# Patient Record
Sex: Female | Born: 1990 | Race: White | Hispanic: No | Marital: Single
Health system: Southern US, Community
[De-identification: ages and names within clinical notes are randomized; demographics above are authoritative.]

---

## 2018-03-02 ENCOUNTER — Encounter (HOSPITAL_COMMUNITY): Payer: Self-pay

## 2018-03-02 ENCOUNTER — Emergency Department (HOSPITAL_COMMUNITY): Payer: Self-pay

## 2018-03-02 ENCOUNTER — Other Ambulatory Visit: Payer: Self-pay

## 2018-03-02 ENCOUNTER — Emergency Department (HOSPITAL_COMMUNITY)
Admission: EM | Admit: 2018-03-02 | Discharge: 2018-03-02 | Disposition: A | Discharge status: Home / Self Care | Payer: Self-pay | Attending: Emergency Medicine | Admitting: Emergency Medicine

## 2018-03-02 DIAGNOSIS — N949 Unspecified condition associated with female genital organs and menstrual cycle: Secondary | ICD-10-CM

## 2018-03-02 DIAGNOSIS — O26892 Other specified pregnancy related conditions, second trimester: Secondary | ICD-10-CM

## 2018-03-02 DIAGNOSIS — O23592 Infection of other part of genital tract in pregnancy, second trimester: Secondary | ICD-10-CM | POA: Insufficient documentation

## 2018-03-02 DIAGNOSIS — N760 Acute vaginitis: Secondary | ICD-10-CM

## 2018-03-02 DIAGNOSIS — Z3A16 16 weeks gestation of pregnancy: Secondary | ICD-10-CM | POA: Insufficient documentation

## 2018-03-02 DIAGNOSIS — Z79899 Other long term (current) drug therapy: Secondary | ICD-10-CM | POA: Insufficient documentation

## 2018-03-02 DIAGNOSIS — R109 Unspecified abdominal pain: Secondary | ICD-10-CM

## 2018-03-02 DIAGNOSIS — B9689 Other specified bacterial agents as the cause of diseases classified elsewhere: Secondary | ICD-10-CM

## 2018-03-02 DIAGNOSIS — R110 Nausea: Secondary | ICD-10-CM

## 2018-03-02 LAB — COMPREHENSIVE METABOLIC PANEL
ALK PHOS: 63 U/L (ref 38–126)
ALT: 15 U/L (ref 0–44)
ANION GAP: 8 (ref 5–15)
AST: 16 U/L (ref 15–41)
Albumin: 3.6 g/dL (ref 3.5–5.0)
BILIRUBIN TOTAL: 0.4 mg/dL (ref 0.3–1.2)
BUN: 5 mg/dL — ABNORMAL LOW (ref 6–20)
CALCIUM: 9.2 mg/dL (ref 8.9–10.3)
CO2: 22 mmol/L (ref 22–32)
Chloride: 108 mmol/L (ref 98–111)
Creatinine, Ser: 0.36 mg/dL — ABNORMAL LOW (ref 0.44–1.00)
GFR calc Af Amer: >60 mL/min (ref >60)
Glucose, Bld: 78 mg/dL (ref 70–99)
POTASSIUM: 3.6 mmol/L (ref 3.5–5.1)
Sodium: 138 mmol/L (ref 135–145)
TOTAL PROTEIN: 7.7 g/dL (ref 6.5–8.1)

## 2018-03-02 LAB — CBC WITH DIFFERENTIAL/PLATELET
Basophils Absolute: 0 10*3/uL (ref 0.0–0.1)
Basophils Relative: 0 %
EOS PCT: 1 %
Eosinophils Absolute: 0.1 10*3/uL (ref 0.0–0.7)
HCT: 36.6 % (ref 36.0–46.0)
Hemoglobin: 13 g/dL (ref 12.0–15.0)
LYMPHS PCT: 24 %
Lymphs Abs: 1.8 10*3/uL (ref 0.7–4.0)
MCH: 30.8 pg (ref 26.0–34.0)
MCHC: 35.5 g/dL (ref 30.0–36.0)
MCV: 86.7 fL (ref 78.0–100.0)
MONO ABS: 0.4 10*3/uL (ref 0.1–1.0)
Monocytes Relative: 6 %
Neutro Abs: 5.1 10*3/uL (ref 1.7–7.7)
Neutrophils Relative %: 69 %
PLATELETS: 201 10*3/uL (ref 150–400)
RBC: 4.22 MIL/uL (ref 3.87–5.11)
RDW: 13.3 % (ref 11.5–15.5)
WBC: 7.3 10*3/uL (ref 4.0–10.5)

## 2018-03-02 LAB — URINALYSIS, ROUTINE W REFLEX MICROSCOPIC
BILIRUBIN URINE: NEGATIVE
GLUCOSE, UA: NEGATIVE mg/dL
HGB URINE DIPSTICK: NEGATIVE
KETONES UR: 20 mg/dL — AB
Leukocytes, UA: NEGATIVE
Nitrite: NEGATIVE
PROTEIN: NEGATIVE mg/dL
Specific Gravity, Urine: 1.006 (ref 1.005–1.030)
pH: 6 (ref 5.0–8.0)

## 2018-03-02 LAB — HCG, QUANTITATIVE, PREGNANCY: HCG, BETA CHAIN, QUANT, S: 28236 m[IU]/mL — AB (ref <5)

## 2018-03-02 LAB — WET PREP, GENITAL
Sperm: NONE SEEN
Trich, Wet Prep: NONE SEEN
Yeast Wet Prep HPF POC: NONE SEEN

## 2018-03-02 MED ORDER — METRONIDAZOLE 500 MG PO TABS: 500.0000 mg | ORAL_TABLET | Freq: Two times a day (BID) | ORAL | 0 refills | Status: AC

## 2018-03-02 NOTE — ED Triage Notes (Signed)
Pt reports she is [redacted] wks gestation, she was playing with her son on 02/28/18 and son accidentally kicked pt in her stomach. She now has pressure and shooting in lower abd area

## 2018-03-02 NOTE — Discharge Instructions (Addendum)
Your ultrasound is reassuring, shows healthy intrauterine pregnancy with normal heart rate, does show that the edge of your placenta is possibly low and ovary or cervix, please follow-up with your OB/GYN with formal ultrasound regarding this, your wet prep does show some bacterial vaginosis please take Flagyl twice daily for the next week, continue with Tylenol as needed for pain, you may also use a belly band to help support as I think a lot of the discomfort you are experiencing is from round ligament pain.  Follow-up with your OB/GYN.  Return to the ED or the [facility] at [facility] if you have significantly worsening pain, vaginal bleeding or any other new or concerning symptoms.

## 2018-03-02 NOTE — ED Provider Notes (Signed)
[facility]-EMERGENCY DEPT Provider Note   CSN: [id number] Arrival date & time: 03/02/18  1534     History   Chief Complaint Chief Complaint  Patient presents with  . Abdominal Pain    HPI Jill Velez is a 27 y.o. female.  Jill Velez is a 27 y.o. Female currently [redacted] weeks pregnant, who presents to the emergency department for evaluation of 2 days of abdominal pain.  She describes it as a dull constant ache that starts at the top of her abdomen and radiates towards her lower abdomen, and it feels like pressure with occasional shooting pains.  Patient reports she is about [redacted] weeks pregnant in 2 days prior when she was in the pool with her son he jumped off of her stomach instead of her knees into the water she has been having pain constantly since then.  She reports a very small amount of spotting immediately after but no vaginal bleeding since then and no vaginal discharge or fluid leak.  She reports some intermittent nausea which she has been treating with alcohol swabs and Phenergan as needed at home as prescribed by her OB she is followed by [facility] in [place], has not had a formal ultrasound yet with this pregnancy, this is her fourth pregnancy, no prior complications.  She denies any diarrhea, melena or hematochezia, no dysuria or urinary frequency.  No fevers or chills.     No past medical history on file.  There are no active problems to display for this patient.   OB History    Gravida  1   Para      Term      Preterm      AB      Living        SAB      TAB      Ectopic      Multiple      Live Births               Home Medications    Prior to Admission medications   Medication Sig Start Date End Date Taking? Authorizing Provider  acetaminophen (TYLENOL) 500 MG tablet Take 500-1,000 mg by mouth every 6 (six) hours as needed for moderate pain.   Yes [provider]  promethazine (PHENERGAN) 25 MG tablet  TAKE 1 TABLET BY MOUTH EVERY 12 HOURS AS NEEDED FOR NAUSEA AND VOMITING 02/04/18  Yes [provider]  metroNIDAZOLE (FLAGYL) 500 MG tablet Take 1 tablet (500 mg total) by mouth 2 (two) times daily. One po bid x 7 days 03/02/18   Dartha Lodge, PA-C    Family History No family history on file.  Social History Social History   Tobacco Use  . Smoking status: Not on file  Substance Use Topics  . Alcohol use: Not on file  . Drug use: Not on file     Allergies   Patient has no known allergies.   Review of Systems Review of Systems  Constitutional: Negative for chills and fever.  HENT: Negative.   Eyes: Negative for visual disturbance.  Respiratory: Negative for shortness of breath.   Cardiovascular: Negative for chest pain.  Gastrointestinal: Positive for abdominal pain, nausea and vomiting. Negative for blood in stool, constipation and diarrhea.  Genitourinary: Negative for dysuria, flank pain, frequency, vaginal bleeding and vaginal discharge.  Musculoskeletal: Negative for arthralgias and myalgias.  Skin: Negative for color change and rash.  Neurological: Negative for dizziness, syncope and light-headedness.  Physical Exam Updated Vital Signs BP 130/77 (BP Location: Left Arm)   Pulse 87   Temp 98.5 F (36.9 C) (Oral)   Resp 15   LMP  (Exact Date)   SpO2 100%   Physical Exam  Constitutional: She appears well-developed and well-nourished. No distress.  HENT:  Head: Normocephalic and atraumatic.  Mouth/Throat: Oropharynx is clear and moist.  Eyes: Right eye exhibits no discharge. Left eye exhibits no discharge.  Cardiovascular: Normal rate, regular rhythm, normal heart sounds and intact distal pulses.  Pulmonary/Chest: Effort normal and breath sounds normal. No respiratory distress.  Respirations equal and unlabored, patient able to speak in full sentences, lungs clear to auscultation bilaterally  Abdominal: There is generalized tenderness. There is no  rigidity, no rebound, no guarding and no CVA tenderness.  Gravid abdomen with uterus approximately at the edge of the umbilicus, there is mild diffuse tenderness without guarding, no ecchymosis, no CVA tenderness  Genitourinary:  Genitourinary Comments: Chaperone present during pelvic exam, cervical os is closed, there is a small amount of thin white discharge present, no bleeding, no cervical motion tenderness or focal adnexal or uterine tenderness.  Neurological: She is alert. Coordination normal.  Skin: Skin is warm and dry. Capillary refill takes less than 2 seconds. She is not diaphoretic.  Psychiatric: She has a normal mood and affect. Her behavior is normal.  Nursing note and vitals reviewed.    ED Treatments / Results  Labs (all labs ordered are listed, but only abnormal results are displayed) Labs Reviewed  WET PREP, GENITAL - Abnormal; Notable for the following components:      Result Value   Clue Cells Wet Prep HPF POC PRESENT (*)    WBC, Wet Prep HPF POC MODERATE (*)    All other components within normal limits  COMPREHENSIVE METABOLIC PANEL - Abnormal; Notable for the following components:   BUN 5 (*)    Creatinine, Ser 0.36 (*)    All other components within normal limits  URINALYSIS, ROUTINE W REFLEX MICROSCOPIC - Abnormal; Notable for the following components:   Color, Urine STRAW (*)    Ketones, ur 20 (*)    All other components within normal limits  HCG, QUANTITATIVE, PREGNANCY - Abnormal; Notable for the following components:   hCG, Beta Chain, Quant, S 28,236 (*)    All other components within normal limits  CBC WITH DIFFERENTIAL/PLATELET    EKG None  Radiology [place] Ob Limited > 14 Wks  Result Date: 03/02/2018 CLINICAL DATA:  Kicked in stomach 2 days ago, lower abdominal pain, [redacted] weeks pregnant EXAM: LIMITED OBSTETRIC ULTRASOUND FINDINGS: Number of Fetuses: 1 Heart Rate:  143 bpm Movement: Present Presentation: Variable Placental Location: Posterior Previa:  Question at least partial previa, distorted lower uterine segment anatomy question due to a posterior wall contraction, does not appear to represent a mass. No definite retroplacental collections. Amniotic Fluid (Subjective):  Within normal limits. BPD: 3.23 cm 16 w  1 d MATERNAL FINDINGS: Cervix:  Appears closed. Uterus/Adnexae: Likely posterior uterine contraction at lower uterine segment. No gross free fluid. Adnexa unremarkable. IMPRESSION: Single live intrauterine gestation. Question uterine contraction at lower segment posteriorly accounting for prominence of the wall and limiting assessment of position of placenta with respect to the cervix; recommend attention on follow-up exams to establish position of the inferior placental edge and exclude placenta previa. No definite retroplacental collections identified. This exam is performed on an emergent basis and does not comprehensively evaluate fetal size, dating, or anatomy; follow-up complete  OB US should be considered if further fetal assessment is warranted. Electronically Signed   By: Ulyses Southward M.D.   On: 03/02/2018 19:15    Procedures Procedures (including critical care time)  Medications Ordered in ED Medications - No data to display   Initial Impression / Assessment and Plan / ED Course  I have reviewed the triage vital signs and the nursing notes.  Pertinent labs & imaging results that were available during my care of the patient were reviewed by me and considered in my medical decision making (see chart for details).  Patient presents approximately [redacted] weeks pregnant with persisting abdominal pain for 2 days after her child accidentally kicked her in the stomach, one small episode of spotting, no other vaginal bleeding or discharge reported.  Mild nausea and vomiting unchanged from her typical with pregnancy, no fevers, no bloody stools no dysuria.  On exam patient appears mildly uncomfortable but is otherwise in no acute distress.   Abdomen is diffusely tender without focal guarding, uterus just above the umbilicus on palpation.  Pelvic exam reveals a closed cervical office with no bleeding and small amount of white discharge.  Wet prep collected.  Will get limited OB ultrasound and labs.  Labs overall reassuring, no leukocytosis, hemoglobin is normal, no acute electrolyte derangements, normal renal and liver function, urinalysis without signs of infection, hCG appropriate for gestational age, wet prep does show signs of bacterial vaginosis.  Ultrasound shows single intrauterine pregnancy approximately 6 weeks and 1 day, with a heart rate of 143, there is some question of possible tarsal placenta previa, and report notes some posterior wall uterine contraction.  Discussed ultrasound findings with Dr. Jolayne Panther with OB/GYN who reports posterior wall uterine contraction is a typical finding in early pregnancy.  Patient will follow-up with her OB later this week for formal ultrasound to assess for placenta previa.  Work-up was otherwise reassuring, pain likely due to round ligament pain from trauma, encourage Tylenol and to use belly band and rest as much as possible.  Return precautions discussed.  Patient expresses understanding and is in agreement with plan.  Final Clinical Impressions(s) / ED Diagnoses   Final diagnoses:  Abdominal pain during pregnancy in second trimester  Nausea  Round ligament pain  BV (bacterial vaginosis)    ED Discharge Orders        Ordered    metroNIDAZOLE (FLAGYL) 500 MG tablet  2 times daily     03/02/18 2055       Dartha Lodge, [place] 03/03/18 1253    Arby Barrette, MD 03/05/18 1205

## 2018-12-19 IMAGING — US US OB LIMITED
1 series · 13 of 28 positions shown · non-contrast
Comparison: none

CLINICAL DATA: Kicked in stomach 2 days ago, lower abdominal pain,
15 weeks pregnant

EXAM:
LIMITED OBSTETRIC ULTRASOUND

[Series 1: us ob limited · 13 of 68 slices shown]
[im 3/68]
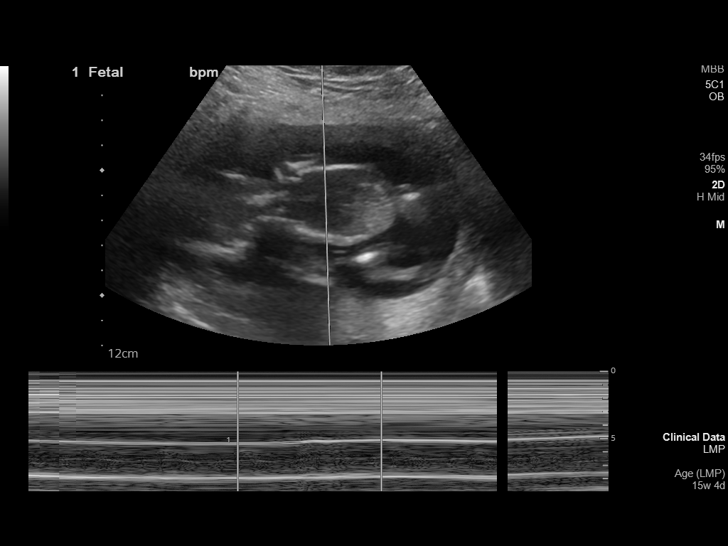
[im 8/68]
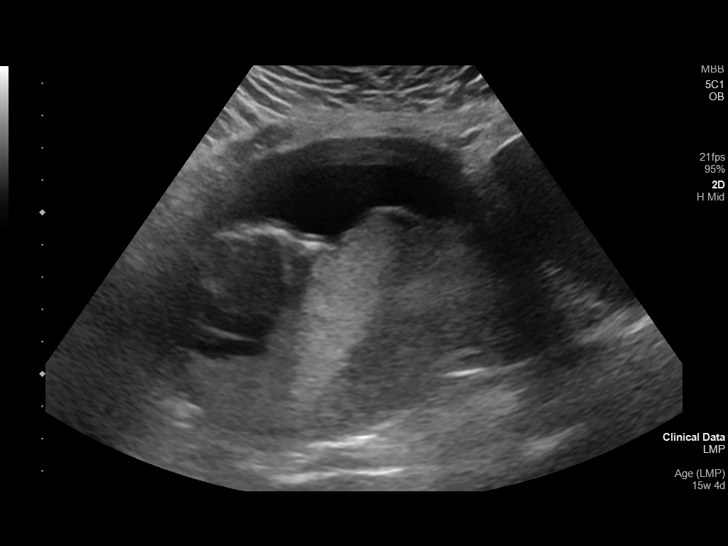
[im 13/68]
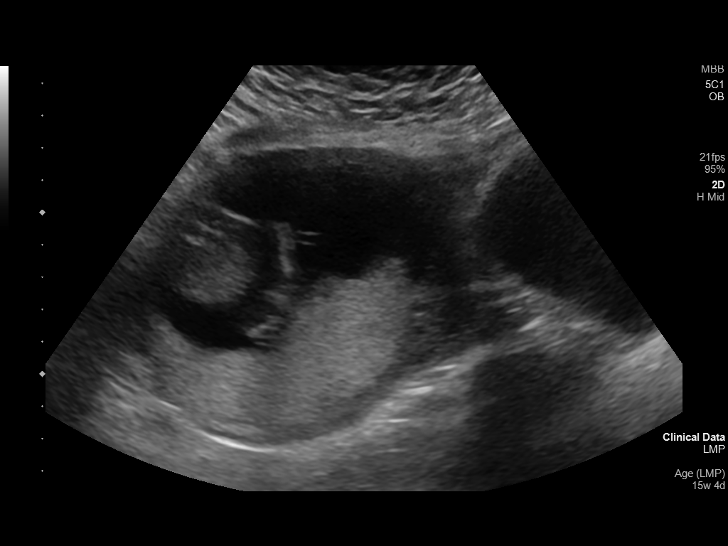
[im 18/68]
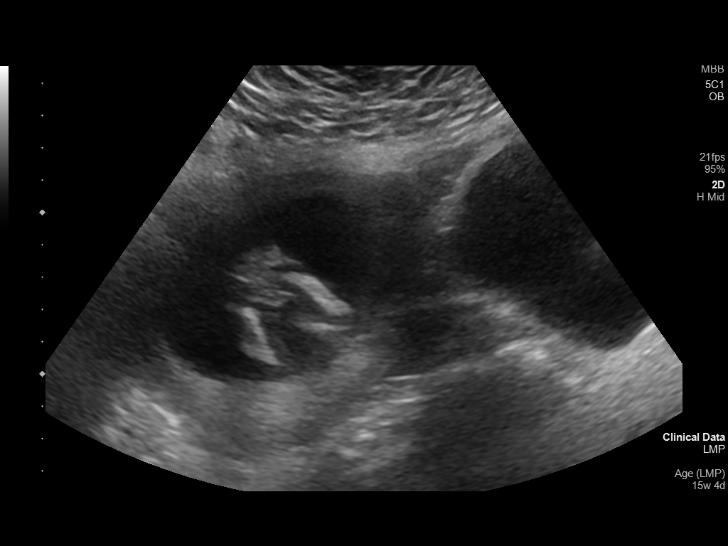
[im 23/68]
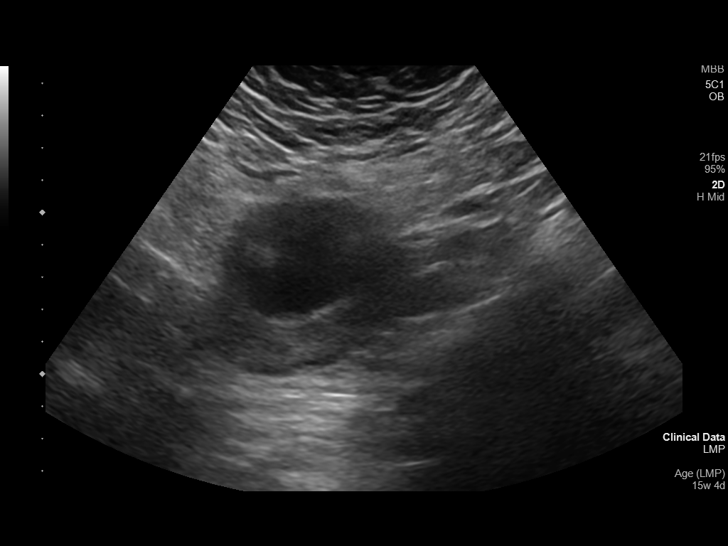
[im 28/68]
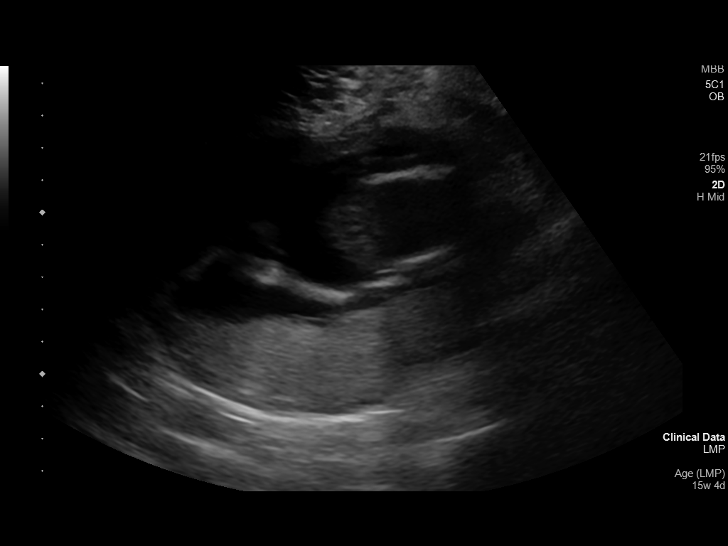
[im 35/68]
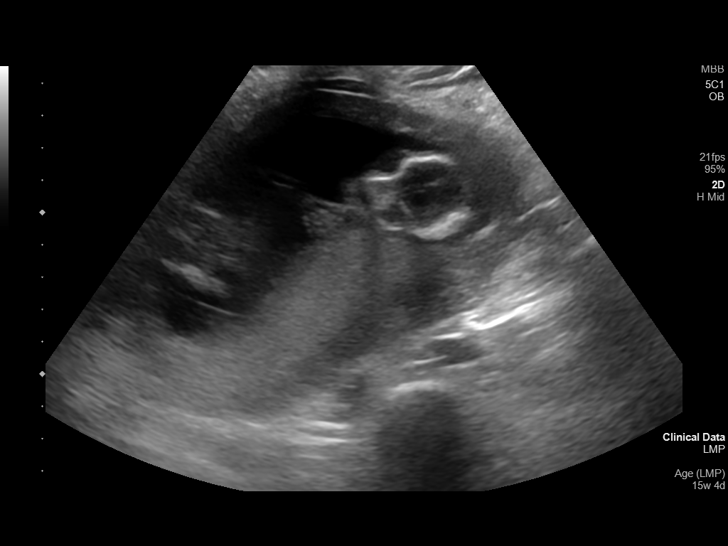
[im 40/68]
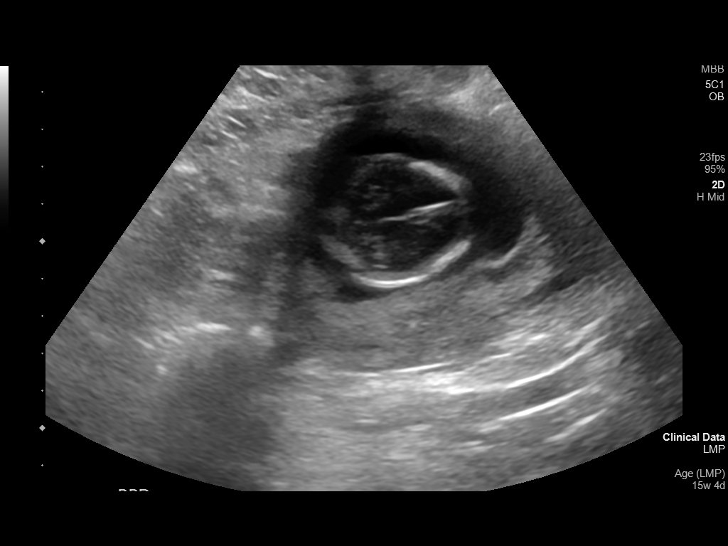
[im 45/68]
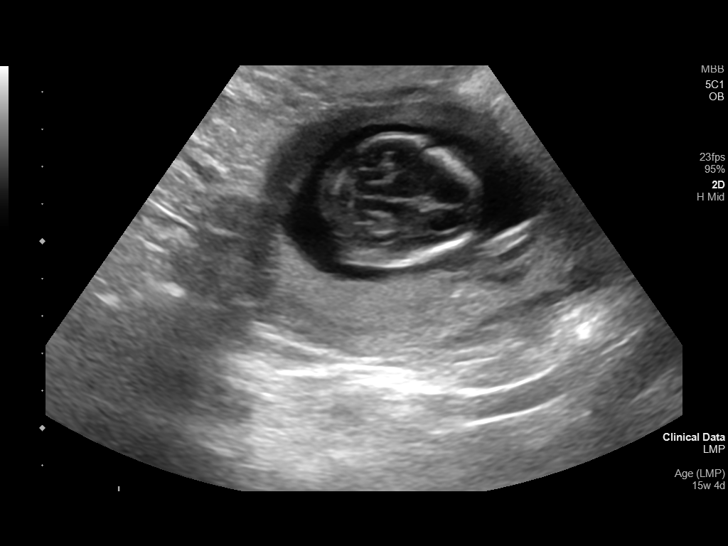
[im 50/68]
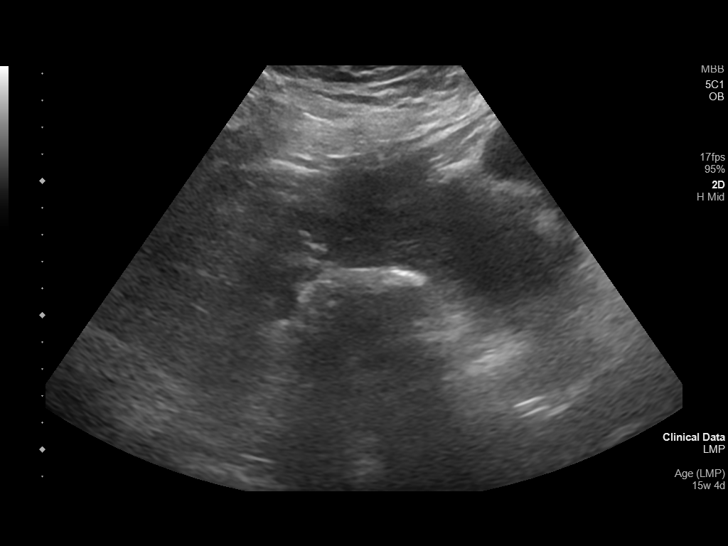
[im 55/68]
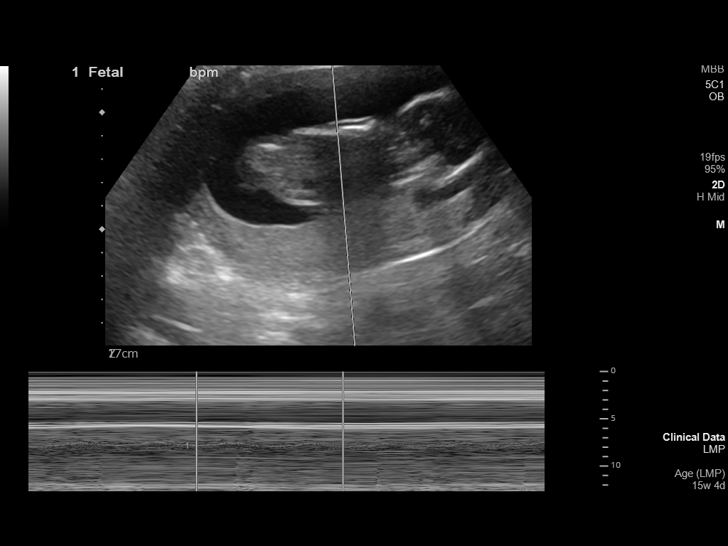
[im 60/68]
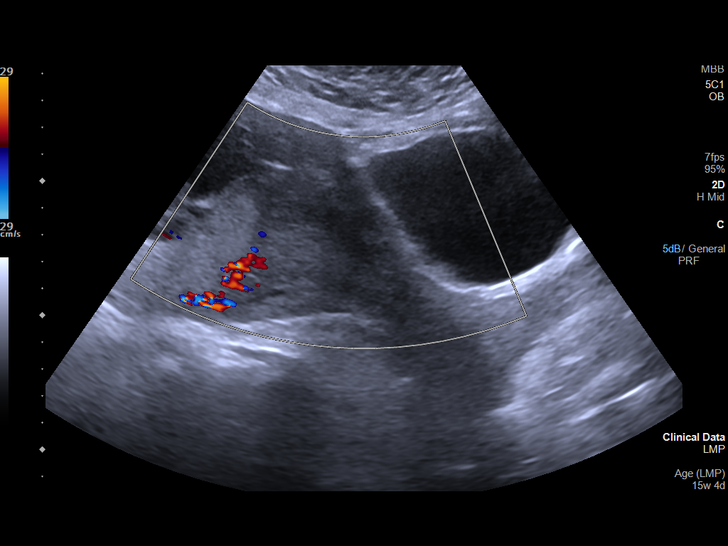
[im 65/68]
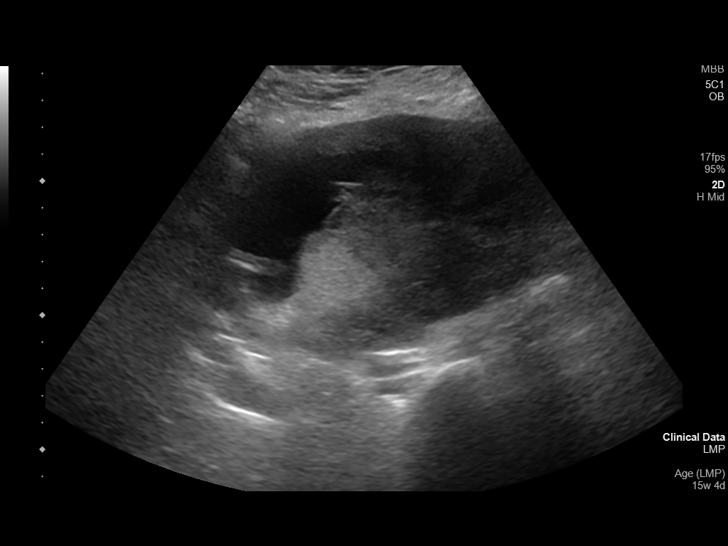

[13 of 28 positions shown; findings below may reference images not displayed]

FINDINGS: Number of Fetuses: 1

Heart Rate:  143 bpm

Movement: Present

Presentation: Variable

Placental Location: Posterior

Previa: Question at least partial previa, distorted lower uterine
segment anatomy question due to a posterior wall contraction, does
not appear to represent a mass. No definite retroplacental
collections.

Amniotic Fluid (Subjective):  Within normal limits.

BPD: 3.23 cm 16 w  1 d

MATERNAL FINDINGS:

Cervix:  Appears closed.

Uterus/Adnexae: Likely posterior uterine contraction at lower
uterine segment. No gross free fluid. Adnexa unremarkable.
IMPRESSION: Single live intrauterine gestation.

Question uterine contraction at lower segment posteriorly accounting
for prominence of the wall and limiting assessment of position of
placenta with respect to the cervix; recommend attention on
follow-up exams to establish position of the inferior placental edge
and exclude placenta previa.

No definite retroplacental collections identified.

This exam is performed on an emergent basis and does not
comprehensively evaluate fetal size, dating, or anatomy; follow-up
complete OB US should be considered if further fetal assessment is
warranted.
